# Patient Record
Sex: Female | Born: 2000 | Race: White | Hispanic: No | Marital: Single
Health system: Southern US, Community
[De-identification: ages and names within clinical notes are randomized; demographics above are authoritative.]

## PROBLEM LIST (undated history)

## (undated) DIAGNOSIS — I7300 Raynaud's syndrome without gangrene: Secondary | ICD-10-CM

## (undated) DIAGNOSIS — F319 Bipolar disorder, unspecified: Secondary | ICD-10-CM

---
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
## 2020-10-26 IMAGING — CR DG CHEST 2V
2 series · 2 of 2 positions shown · non-contrast
Comparison: None.

CLINICAL DATA: Chest pain.

EXAM:
CHEST - 2 VIEW

[w chest pa]
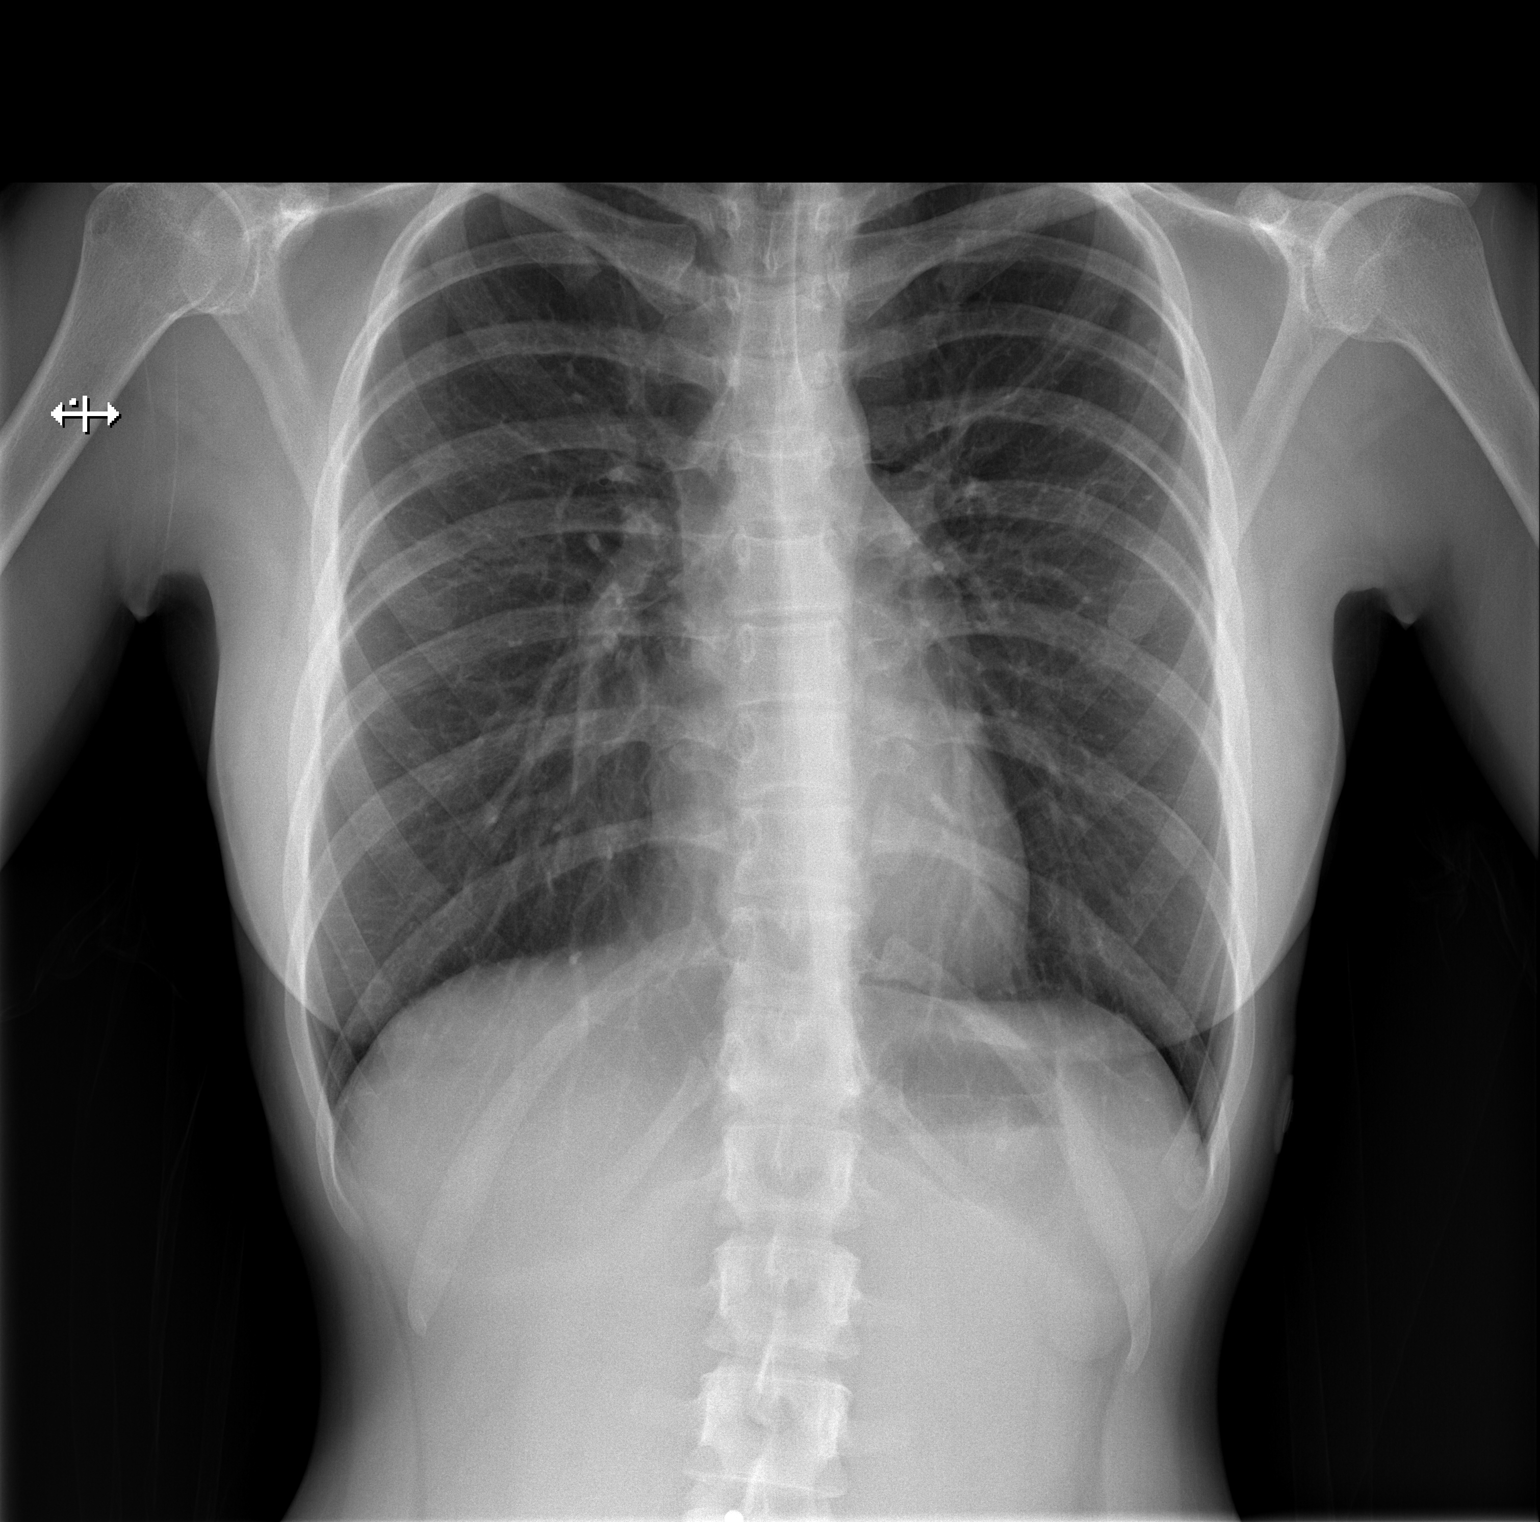

[w chest lat]
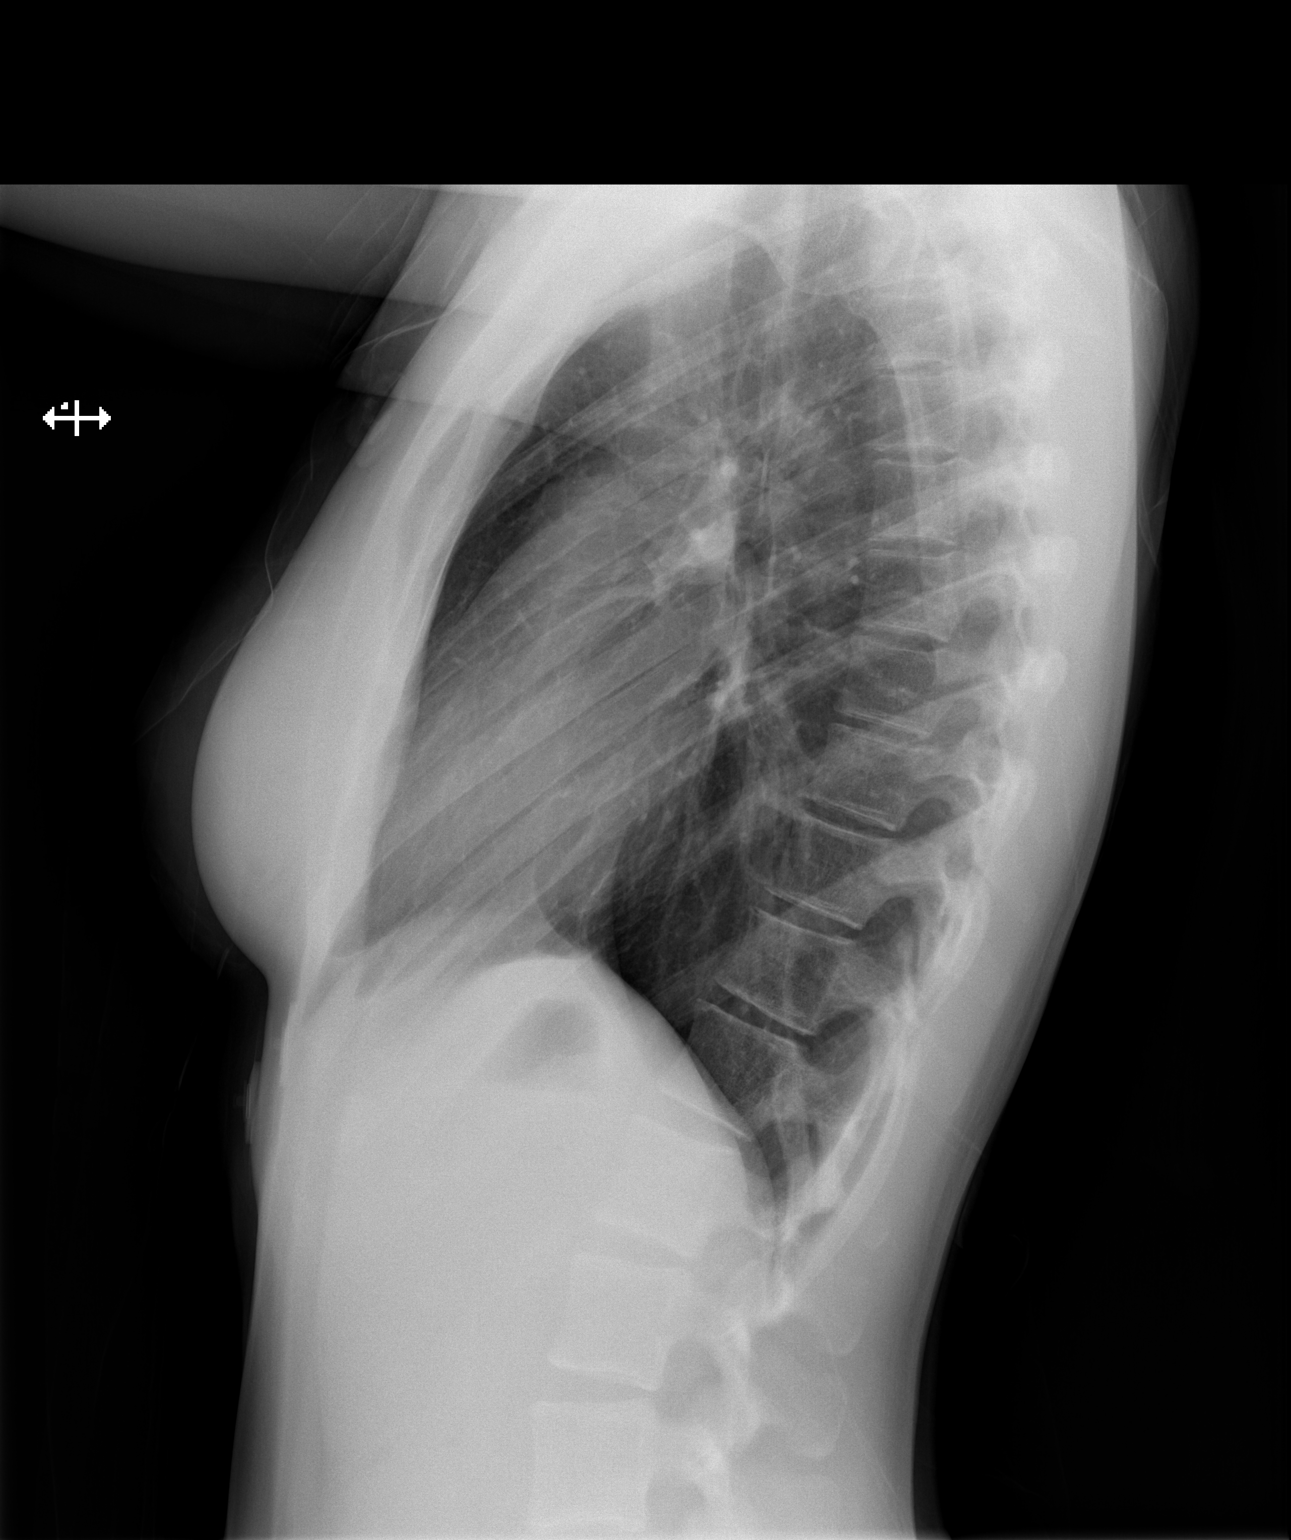

[2 of 2 positions shown; findings below may reference images not displayed]

FINDINGS: The cardiac silhouette, mediastinal and hilar contours are normal.
The lungs are clear. No pleural effusions or pulmonary lesions. The
bony thorax is intact.
IMPRESSION: Normal chest x-ray.
